# Patient Record
Sex: Male | Born: 1985 | Race: White | Hispanic: No | Marital: Married | Smoking: Never smoker
Health system: Southern US, Community
[De-identification: ages and names within clinical notes are randomized; demographics above are authoritative.]

---

## 2012-12-18 ENCOUNTER — Other Ambulatory Visit: Payer: Self-pay | Admitting: Emergency Medicine

## 2012-12-18 ENCOUNTER — Ambulatory Visit (INDEPENDENT_AMBULATORY_CARE_PROVIDER_SITE_OTHER): Payer: 59

## 2012-12-18 DIAGNOSIS — R7611 Nonspecific reaction to tuberculin skin test without active tuberculosis: Secondary | ICD-10-CM

## 2014-01-29 ENCOUNTER — Ambulatory Visit (INDEPENDENT_AMBULATORY_CARE_PROVIDER_SITE_OTHER): Payer: Self-pay

## 2014-01-29 ENCOUNTER — Other Ambulatory Visit: Payer: Self-pay | Admitting: Adult Health

## 2014-01-29 DIAGNOSIS — R7611 Nonspecific reaction to tuberculin skin test without active tuberculosis: Secondary | ICD-10-CM

## 2014-01-29 DIAGNOSIS — Z9289 Personal history of other medical treatment: Secondary | ICD-10-CM

## 2015-02-01 ENCOUNTER — Other Ambulatory Visit: Payer: Self-pay | Admitting: Adult Health

## 2015-02-01 ENCOUNTER — Ambulatory Visit (INDEPENDENT_AMBULATORY_CARE_PROVIDER_SITE_OTHER): Payer: Self-pay

## 2015-02-01 DIAGNOSIS — R7611 Nonspecific reaction to tuberculin skin test without active tuberculosis: Secondary | ICD-10-CM

## 2016-03-04 IMAGING — CR DG CHEST 1V
1 series · 1 of 1 positions shown · non-contrast
Comparison: Chest x-ray of January 29, 2014

CLINICAL DATA: Positive TB skin test, asymptomatic, nonsmoker.

EXAM:
CHEST  1 VIEW

[chest pa]
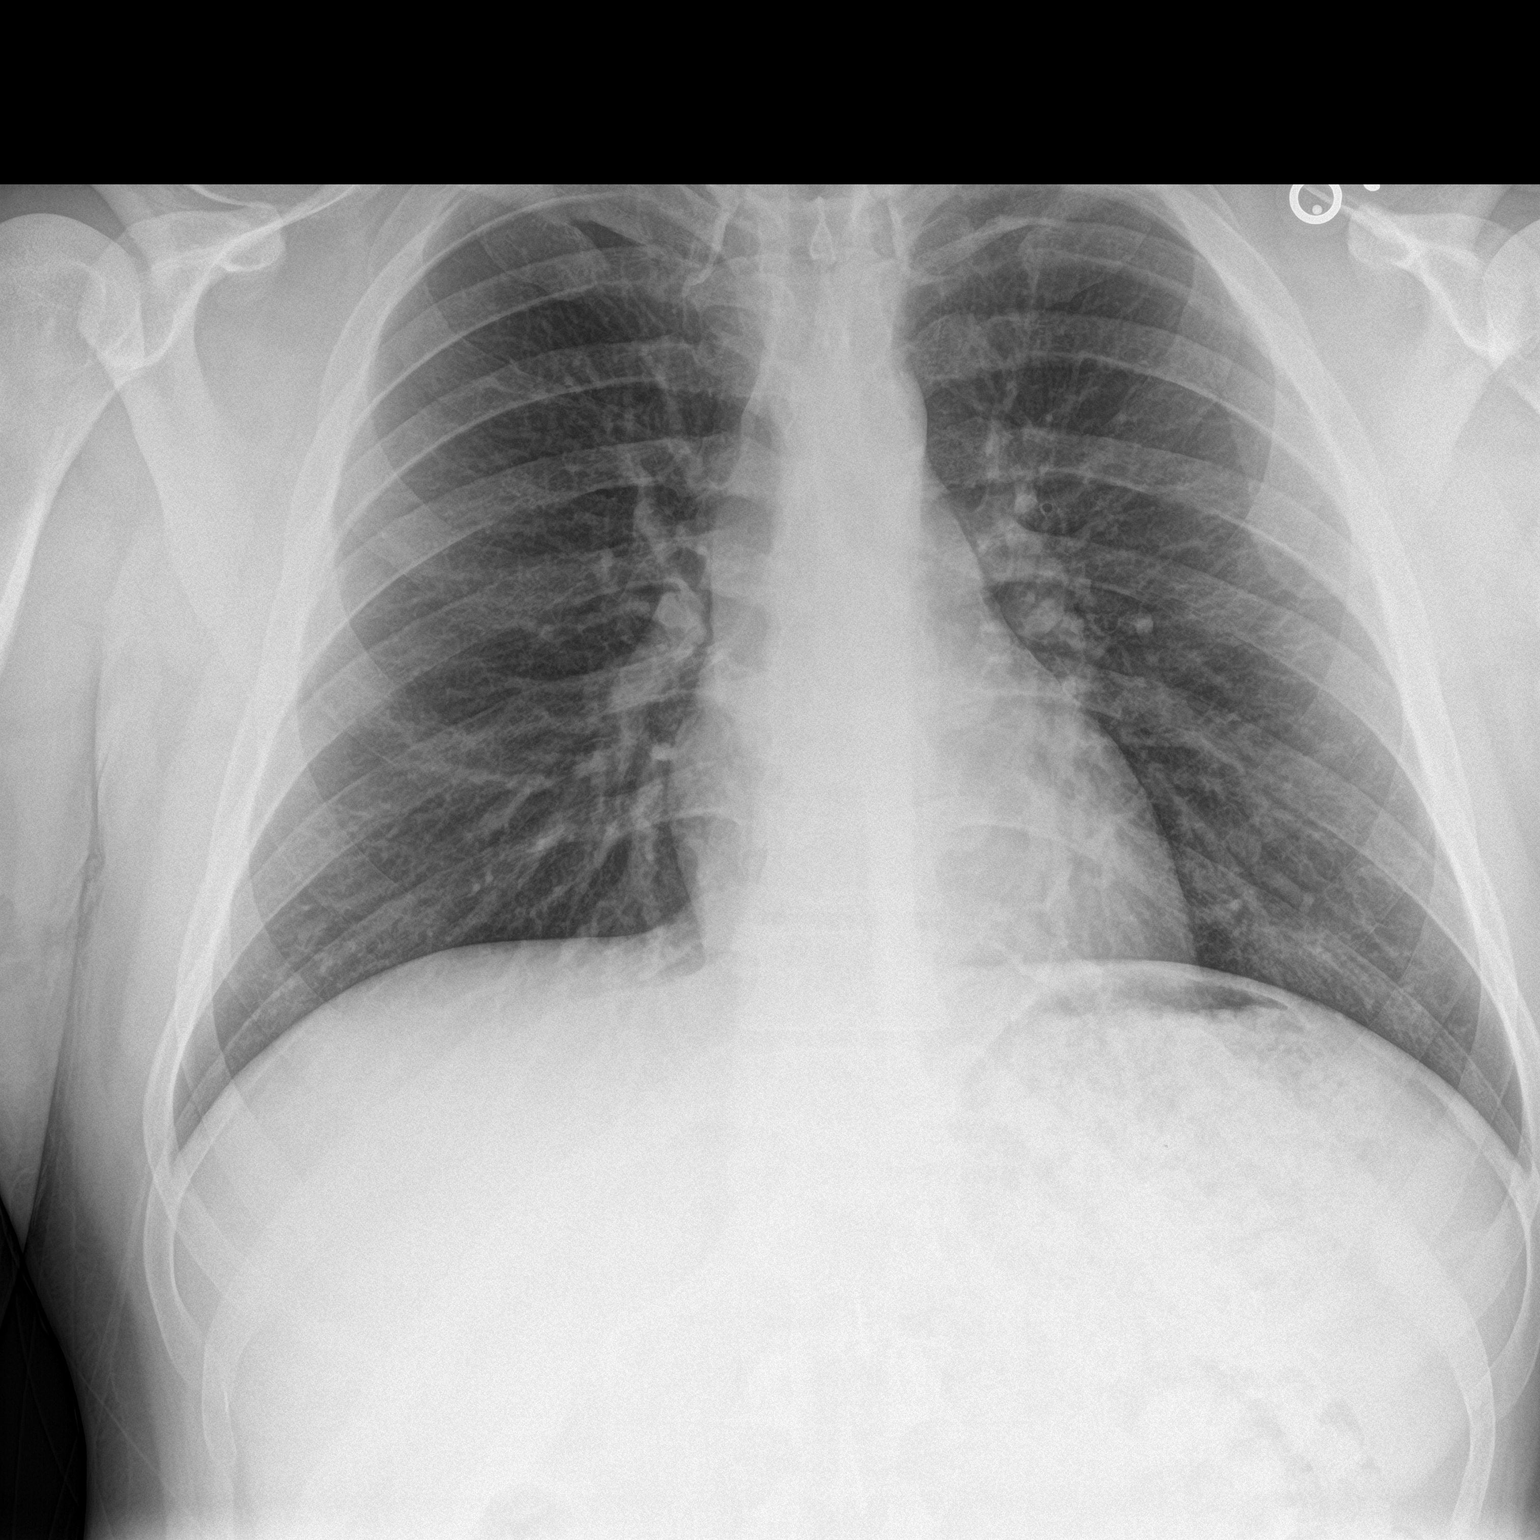

[1 of 1 positions shown; findings below may reference images not displayed]

FINDINGS: The lungs are adequately inflated and clear. The heart and
mediastinal structures are normal. There is no pleural effusion. The
bony thorax is unremarkable.
IMPRESSION: There is no active cardiopulmonary disease. There is no evidence of
acute or old tuberculous infection.

## 2016-03-19 ENCOUNTER — Other Ambulatory Visit: Payer: Self-pay | Admitting: Adult Health

## 2016-03-19 ENCOUNTER — Ambulatory Visit (INDEPENDENT_AMBULATORY_CARE_PROVIDER_SITE_OTHER): Payer: Self-pay

## 2016-03-19 DIAGNOSIS — R7611 Nonspecific reaction to tuberculin skin test without active tuberculosis: Secondary | ICD-10-CM

## 2016-07-26 ENCOUNTER — Encounter: Payer: Self-pay | Admitting: Emergency Medicine

## 2016-07-26 ENCOUNTER — Emergency Department (INDEPENDENT_AMBULATORY_CARE_PROVIDER_SITE_OTHER)
Admission: EM | Admit: 2016-07-26 | Discharge: 2016-07-26 | Disposition: A | Discharge status: Home / Self Care | Payer: Worker's Compensation | Source: Home / Self Care | Attending: Family Medicine | Admitting: Family Medicine

## 2016-07-26 DIAGNOSIS — M545 Low back pain, unspecified: Secondary | ICD-10-CM

## 2016-07-26 DIAGNOSIS — S39012A Strain of muscle, fascia and tendon of lower back, initial encounter: Secondary | ICD-10-CM | POA: Diagnosis not present

## 2016-07-26 DIAGNOSIS — Y990 Civilian activity done for income or pay: Secondary | ICD-10-CM

## 2016-07-26 MED ORDER — CYCLOBENZAPRINE HCL 10 MG PO TABS: 10.0000 mg | ORAL_TABLET | Freq: Two times a day (BID) | ORAL | 0 refills | Status: AC | PRN

## 2016-07-26 MED ORDER — HYDROCODONE-ACETAMINOPHEN 5-325 MG PO TABS: 1.0000 | ORAL_TABLET | Freq: Four times a day (QID) | ORAL | 0 refills | Status: AC | PRN

## 2016-07-26 MED ORDER — MELOXICAM 7.5 MG PO TABS: ORAL_TABLET | ORAL | 0 refills | Status: AC

## 2016-07-26 NOTE — ED Provider Notes (Signed)
CSN: [id number]     Arrival date & time 07/26/16  1748 History   First MD Initiated Contact with Patient 07/26/16 1815     No chief complaint on file.  (Consider location/radiation/quality/duration/timing/severity/associated sxs/prior Treatment) HPI Jesse Barrett is a 30 y.o. male presenting to [facility] with a work related back injury that occurred around 3PM today.  Pt works [employer]. He notes he was turning a large wheel to pull up a hose.  As he was turning he felt a "pull or a pop" in his back and felt immediate pain.  Pain is sharp and shooting, 6/10 at the middle of his lower back.  Pain feels like it is causing his back to tighten up.  Pain worse with certain movements. Increased pain when standing up completely straight and increased pain bending forward.  Denies pain or numbness in arms or legs.  He reports mild back aches in the past from work or golfing but never this severe. Denies hx of back surgeries.     History reviewed. No pertinent past medical history. History reviewed. No pertinent surgical history. History reviewed. No pertinent family history. Social History  Substance Use Topics  . Smoking status: Never Smoker  . Smokeless tobacco: Never Used  . Alcohol use Yes    Review of Systems  Musculoskeletal: Positive for back pain, gait problem ( due to the back pain, hard to stand up straight) and myalgias. Negative for neck pain and neck stiffness.  Skin: Negative for color change, rash and wound.  Neurological: Negative for weakness and numbness.    Allergies  Review of patient's allergies indicates no known allergies.  Home Medications   Prior to Admission medications   Medication Sig Start Date End Date Taking? Authorizing Provider  cyclobenzaprine (FLEXERIL) 10 MG tablet Take 1 tablet (10 mg total) by mouth 2 (two) times daily as needed. 07/26/16   Junius FinnerErin O'Malley, PA-C  HYDROcodone-acetaminophen (NORCO/VICODIN) 5-325 MG tablet Take 1-2  tablets by mouth every 6 (six) hours as needed for moderate pain or severe pain. 07/26/16   Junius FinnerErin O'Malley, PA-C  meloxicam (MOBIC) 7.5 MG tablet Take 2 tabs (15mg ) daily for 7 days, then 1-2 tabs daily as needed for pain. 07/26/16   Junius FinnerErin O'Malley, PA-C   Meds Ordered and Administered this Visit  Medications - No data to display  BP 130/76 (BP Location: Right Arm)   Pulse 66   Temp 98 F (36.7 C) (Oral)   Resp 18   Ht 6\' 2"  (1.88 m)   Wt 250 lb (113.4 kg)   SpO2 98%   BMI 32.10 kg/m  No data found.   Physical Exam  Constitutional: He is oriented to person, place, and time. He appears well-developed and well-nourished. No distress.  Pt sitting in exam chair, appears mildly uncomfortable. Cooperative during exam.  HENT:  Head: Normocephalic and atraumatic.  Eyes: EOM are normal.  Neck: Normal range of motion.  Cardiovascular: Normal rate.   Pulmonary/Chest: Effort normal.  Musculoskeletal: Normal range of motion. He exhibits tenderness. He exhibits no edema.  Mild tenderness over lumbarsacral joint.  Full ROM upper and lower extremities bilaterally with 5/5 strength. Increased pain going from sitting to standing. Worse pain with leaning forward.   Neurological: He is alert and oriented to person, place, and time.  Reflex Scores:      Patellar reflexes are 2+ on the right side and 2+ on the left side. Skin: Skin is warm and dry. He is not diaphoretic.  Psychiatric: He has a normal mood and affect. His behavior is normal.  Nursing note and vitals reviewed.   Urgent Care Course   Clinical Course    Procedures (including critical care time)  Labs Review Labs Reviewed - No data to display  Imaging Review No results found.  MDM   1. Strain of lumbar region, initial encounter   2. Acute midline low back pain without sciatica   3. Work related injury    Pt c/o lower back pain from injury at work earlier today.  No red flag symptoms. No indication for imaging at this  time.   Will treat for lumbar strain.  Rx: norco, flexeril, and meloxicam  Home care instructions provided. Pt not scheduled to return to work until Tuesday 10/31. Pt may return to normal duty Tuesday if feeling better. If not, recommend he f/u with employee health on Monday or Tuesday (10/30 or 10/31) next week. Patient verbalized understanding and agreement with treatment plan.     Junius Finner, PA-C 07/27/16 1030

## 2016-07-26 NOTE — ED Triage Notes (Signed)
Patient works for [employer] and during training today he twisted and hurt lower back. He took ibuprofen 800mg  one hour prior to admission.

## 2016-07-26 NOTE — Discharge Instructions (Signed)
°  Flexeril is a muscle relaxer and may cause drowsiness. Do not drink alcohol, drive, or operate heavy machinery while taking.  Meloxicam (Mobic) is an antiinflammatory to help with pain and inflammation.  Do not take ibuprofen, Advil, Aleve, or any other medications that contain NSAIDs while taking meloxicam as this may cause stomach upset or even ulcers if taken in large amounts for an extended period of time.   Norco/Vicodin (hydrocodone-acetaminophen) is a narcotic pain medication, do not combine these medications with others containing tylenol. While taking, do not drink alcohol, drive, or perform any other activities that requires focus while taking these medications.

## 2017-04-20 IMAGING — DX DG CHEST 2V
2 series · 2 of 2 positions shown · non-contrast
Comparison: [facility] chest x-ray February 01, 2015

CLINICAL DATA: False positive TB test, screening study,
asymptomatic, nonsmoker.

EXAM:
CHEST  2 VIEW

[chest pa]
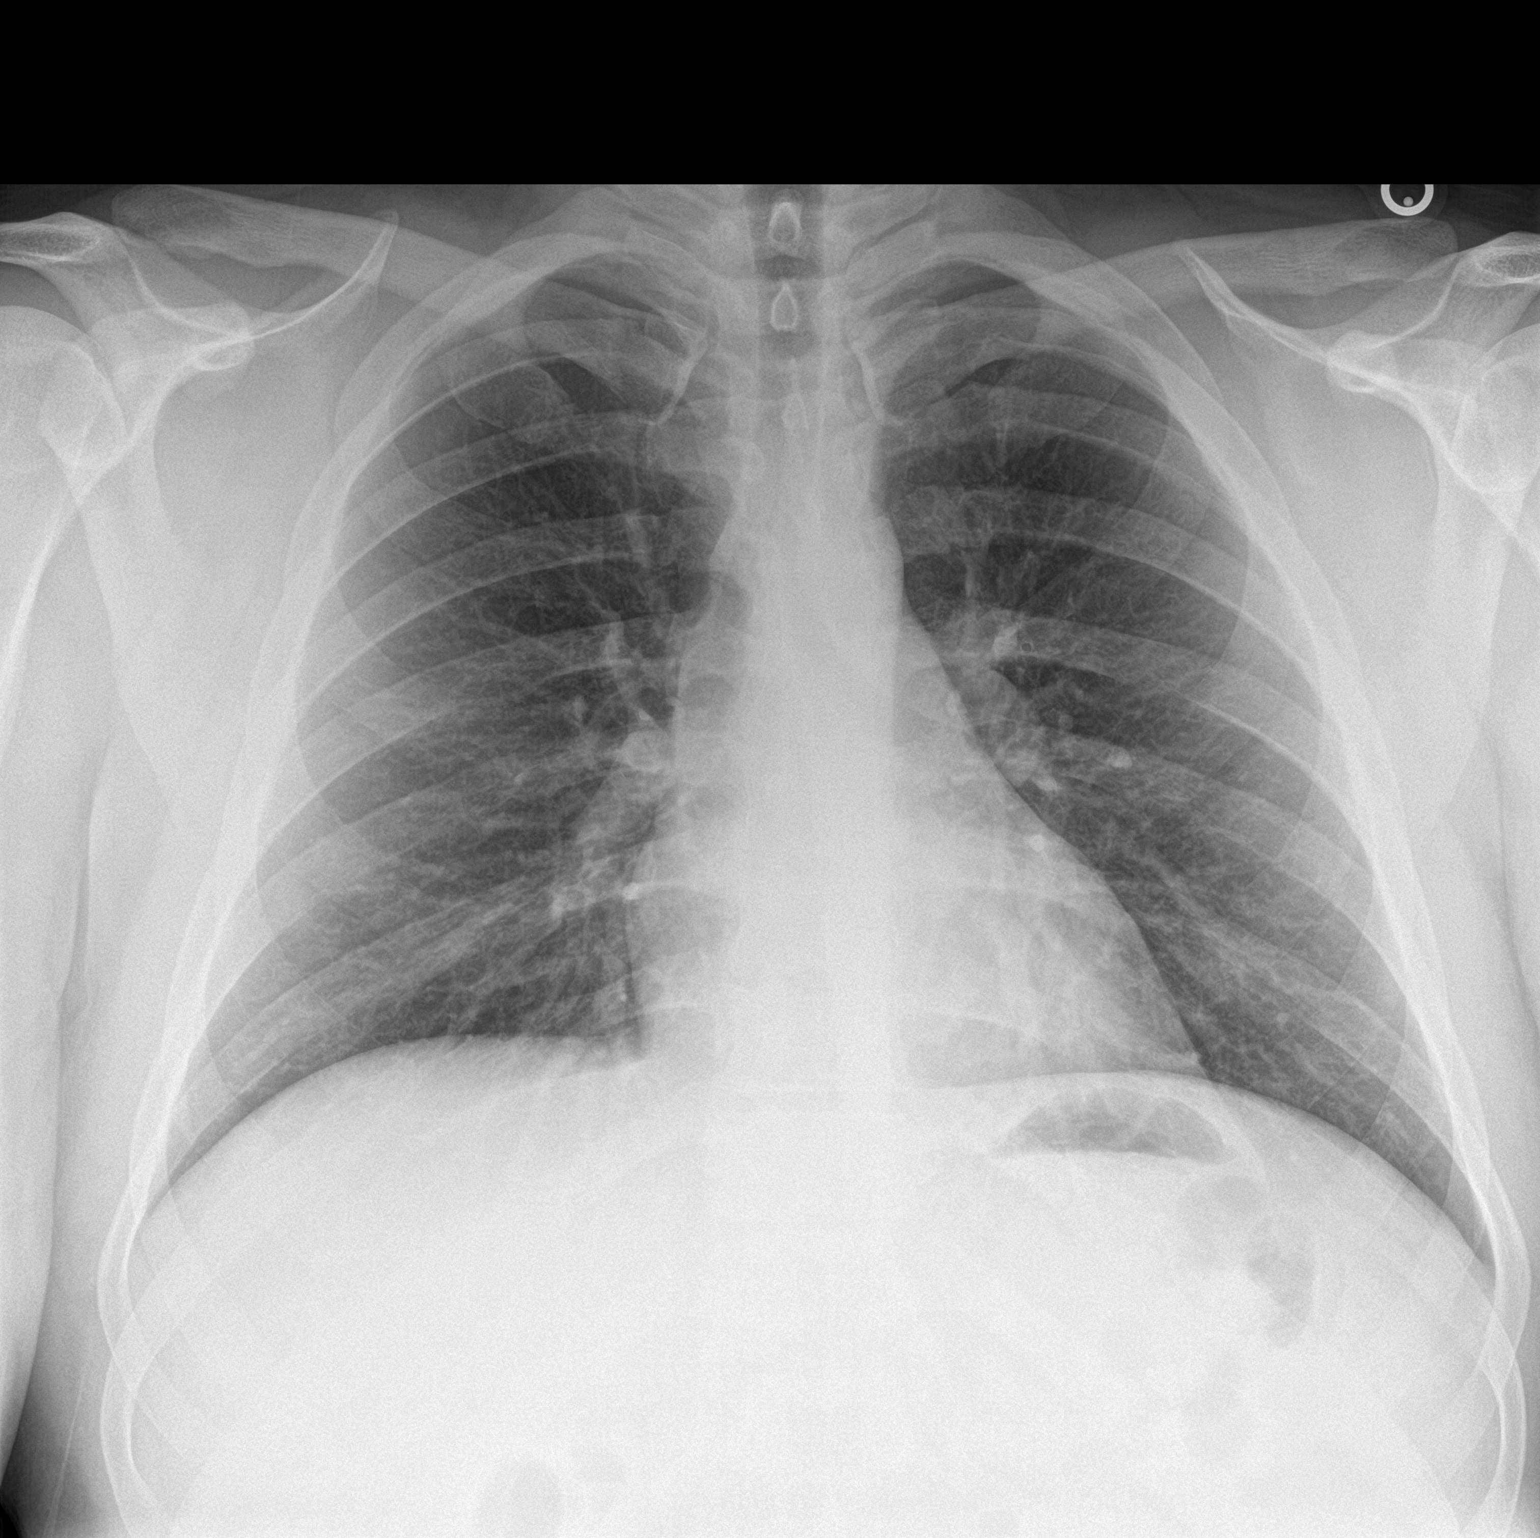

[chest lat]
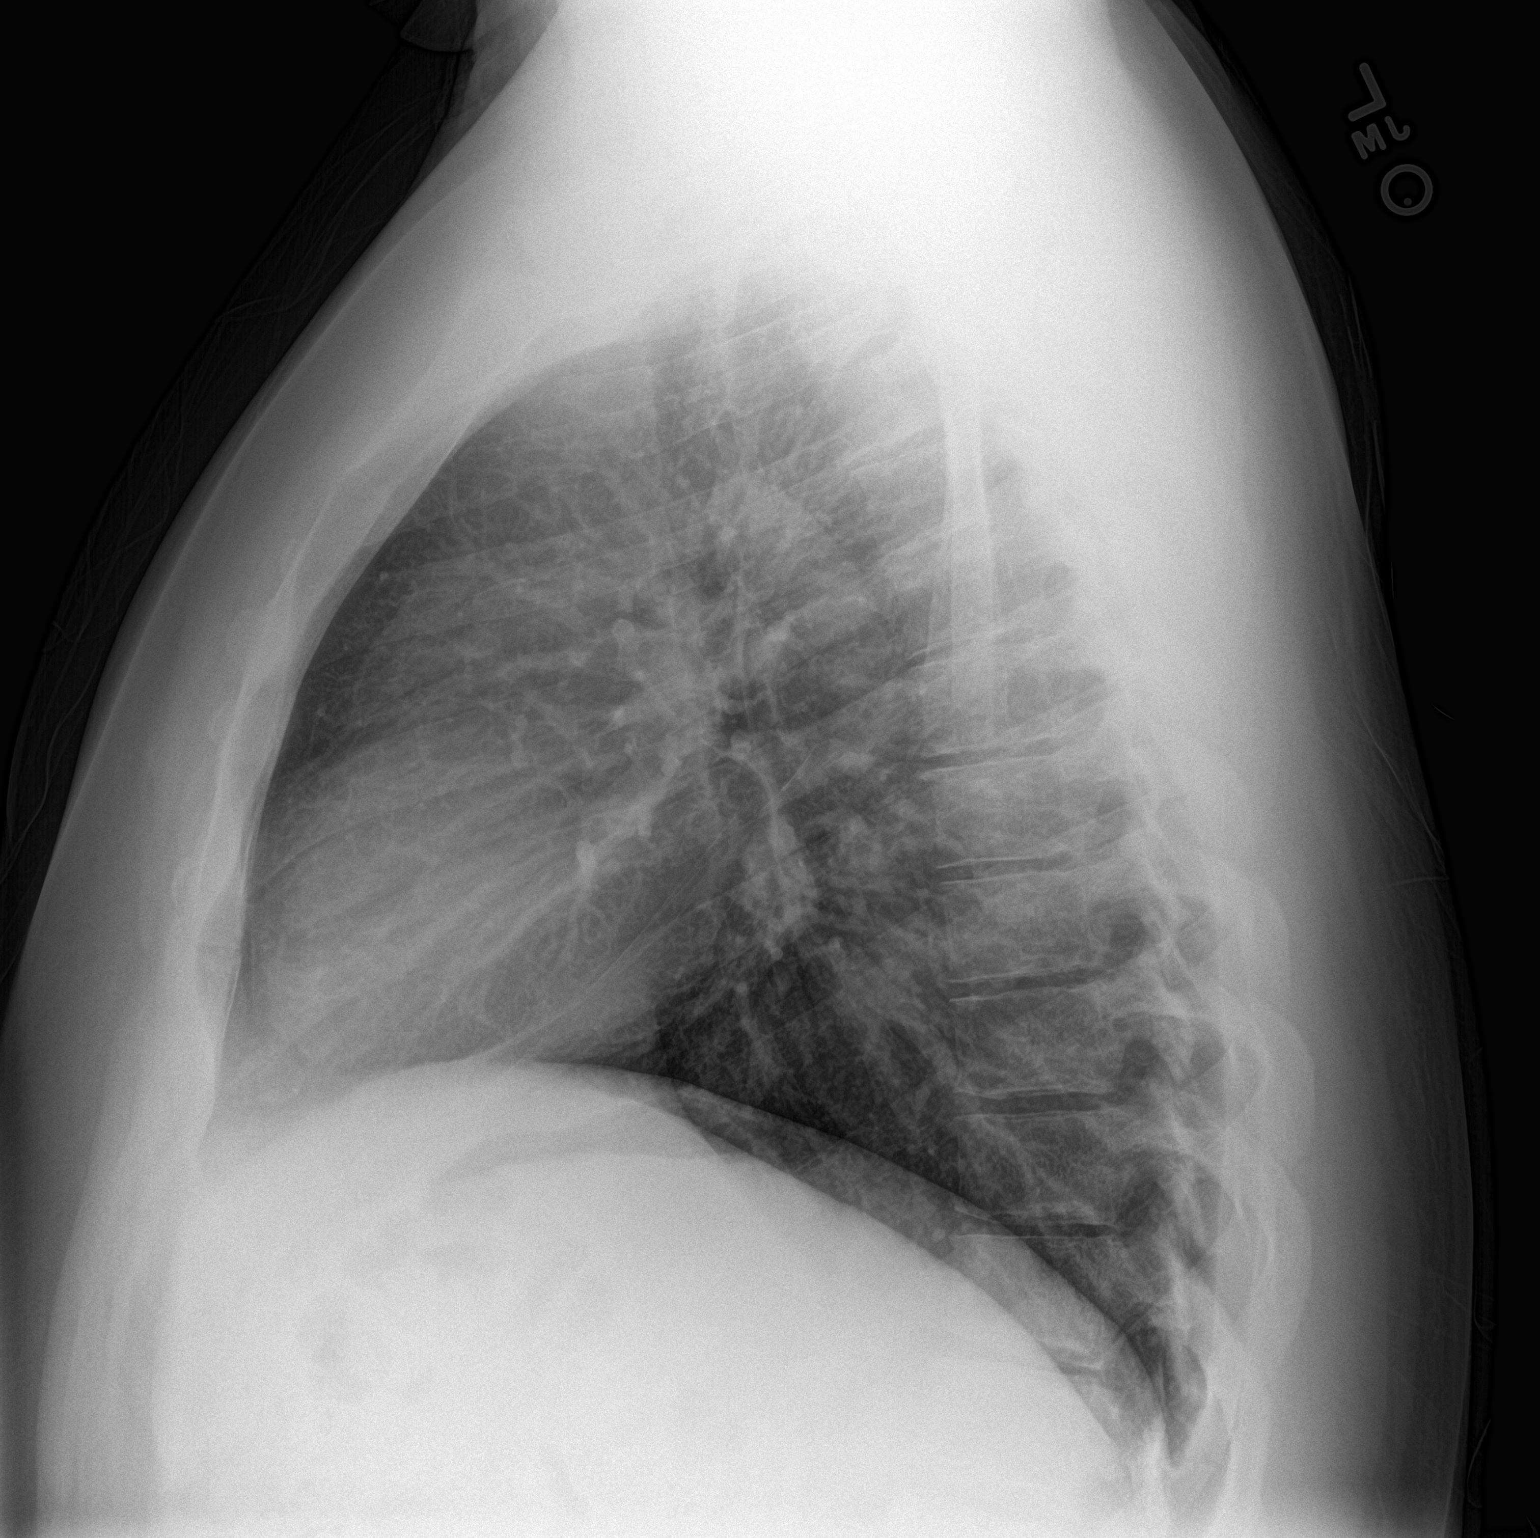

[2 of 2 positions shown; findings below may reference images not displayed]

FINDINGS: The lungs are well-expanded and clear. The heart and pulmonary
vascularity are normal. The mediastinum is normal in width. There is
no pleural effusion. The trachea is midline. The bony thorax
exhibits no acute abnormality.
IMPRESSION: There is no evidence of acute or old tuberculous infection nor other
active cardiopulmonary disease.
